# Patient Record
Sex: Female | Born: 2000 | Race: Black or African American | Hispanic: No | Marital: Single | Smoking: Never smoker
Health system: Southern US, Community
[De-identification: ages and names within clinical notes are randomized; demographics above are authoritative.]

## PROBLEM LIST (undated history)

## (undated) DIAGNOSIS — I10 Essential (primary) hypertension: Secondary | ICD-10-CM

## (undated) DIAGNOSIS — J45909 Unspecified asthma, uncomplicated: Secondary | ICD-10-CM

## (undated) DIAGNOSIS — L309 Dermatitis, unspecified: Secondary | ICD-10-CM

## (undated) DIAGNOSIS — M940 Chondrocostal junction syndrome [Tietze]: Secondary | ICD-10-CM

## (undated) HISTORY — DX: Essential (primary) hypertension: I10

---

## 2019-11-20 ENCOUNTER — Ambulatory Visit: Payer: Self-pay

## 2019-11-22 ENCOUNTER — Ambulatory Visit: Payer: Self-pay | Attending: Family

## 2019-11-22 DIAGNOSIS — Z23 Encounter for immunization: Secondary | ICD-10-CM

## 2019-11-22 NOTE — Progress Notes (Signed)
   Covid-19 Vaccination Clinic  Name:  Ellen Mann    MRN: [id number] DOB: March 09, 2001  11/22/2019  [name] was observed post Covid-19 immunization for 30 minutes based on pre-vaccination screening without incident. She was provided with Vaccine Information Sheet and instruction to access the V-Safe system.   [name] was instructed to call 911 with any severe reactions post vaccine: Marland Kitchen Difficulty breathing  . Swelling of face and throat  . A fast heartbeat  . A bad rash all over body  . Dizziness and weakness   Immunizations Administered    Name Date Dose VIS Date Route   JANSSEN COVID-19 VACCINE 11/22/2019 10:04 AM 0.5 mL 10/18/2019 Intramuscular   Manufacturer: [employer]   Lot: [id number]   NDC: 70786-754-49

## 2020-06-25 ENCOUNTER — Ambulatory Visit: Payer: 59 | Attending: Family

## 2020-06-25 DIAGNOSIS — Z23 Encounter for immunization: Secondary | ICD-10-CM

## 2020-07-01 ENCOUNTER — Ambulatory Visit (HOSPITAL_COMMUNITY)
Admission: EM | Admit: 2020-07-01 | Discharge: 2020-07-01 | Disposition: A | Discharge status: Home / Self Care | Payer: 59

## 2020-07-01 ENCOUNTER — Other Ambulatory Visit: Payer: Self-pay

## 2020-07-18 ENCOUNTER — Ambulatory Visit (HOSPITAL_COMMUNITY)
Admission: EM | Admit: 2020-07-18 | Discharge: 2020-07-18 | Disposition: A | Discharge status: Home / Self Care | Payer: 59

## 2020-07-18 ENCOUNTER — Ambulatory Visit (INDEPENDENT_AMBULATORY_CARE_PROVIDER_SITE_OTHER): Payer: 59

## 2020-07-18 ENCOUNTER — Other Ambulatory Visit: Payer: Self-pay

## 2020-07-18 ENCOUNTER — Encounter (HOSPITAL_COMMUNITY): Payer: Self-pay | Admitting: *Deleted

## 2020-07-18 DIAGNOSIS — M7989 Other specified soft tissue disorders: Secondary | ICD-10-CM

## 2020-07-18 DIAGNOSIS — S90122A Contusion of left lesser toe(s) without damage to nail, initial encounter: Secondary | ICD-10-CM

## 2020-07-18 DIAGNOSIS — M79672 Pain in left foot: Secondary | ICD-10-CM

## 2020-07-18 HISTORY — DX: Chondrocostal junction syndrome (tietze): M94.0

## 2020-07-18 HISTORY — DX: Unspecified asthma, uncomplicated: J45.909

## 2020-07-18 HISTORY — DX: Dermatitis, unspecified: L30.9

## 2020-07-18 MED ORDER — IBUPROFEN 800 MG PO TABS: 800.0000 mg | ORAL_TABLET | Freq: Three times a day (TID) | ORAL | 0 refills | Status: AC

## 2020-07-18 NOTE — ED Provider Notes (Signed)
[facility]    CSN: [id number] Arrival date & time: 07/18/20  1042      History   Chief Complaint Chief Complaint  Patient presents with   Foot Injury    HPI Ellen Mann is a 19 y.o. female history of asthma presenting today for evaluation of left foot injury.  Patient reports yesterday she was walking in hit her toe/foot on a door frame.  Since she has developed increased pain and swelling mainly to the fifth toe.  Denies pain in ankle or dorsum of foot.  Denies numbness or tingling.  HPI  Past Medical History:  Diagnosis Date   Asthma    Costochondritis    Eczema     There are no problems to display for this patient.   History reviewed. No pertinent surgical history.  OB History   No obstetric history on file.      Home Medications    Prior to Admission medications   Medication Sig Start Date End Date Taking? Authorizing Provider  albuterol (VENTOLIN HFA) 108 (90 Base) MCG/ACT inhaler Inhale into the lungs every 6 (six) hours as needed for wheezing or shortness of breath.   Yes [provider]  Dupilumab (DUPIXENT [place]) Inject into the skin.   Yes [provider]  Multiple Vitamin (MULTIVITAMIN PO) Take by mouth.   Yes [provider]  UNKNOWN TO PATIENT Oral contraceptives   Yes [provider]  ibuprofen (ADVIL) 800 MG tablet Take 1 tablet (800 mg total) by mouth 3 (three) times daily. 07/18/20   Kensie Susman, Junius Creamer, PA-C    Family History Family History  Problem Relation Age of Onset   Hypertension Mother    Asthma Father     Social History Social History   Tobacco Use   Smoking status: Never Smoker   Smokeless tobacco: Never Used  Building services engineer Use: Never used  Substance Use Topics   Alcohol use: Not Currently   Drug use: Never     Allergies   Patient has no known allergies.   Review of Systems Review of Systems  Constitutional: Negative for fatigue and fever.  HENT:  Negative for mouth sores.   Eyes: Negative for visual disturbance.  Respiratory: Negative for shortness of breath.   Cardiovascular: Negative for chest pain.  Gastrointestinal: Negative for abdominal pain, nausea and vomiting.  Genitourinary: Negative for genital sores.  Musculoskeletal: Positive for arthralgias and joint swelling.  Skin: Negative for color change, rash and wound.  Neurological: Negative for dizziness, weakness, light-headedness and headaches.     Physical Exam Triage Vital Signs ED Triage Vitals [07/18/20 1147]  Enc Vitals Group     BP 140/87     Pulse Rate 82     Resp 16     Temp 97.8 F (36.6 C)     Temp Source Oral     SpO2 100 %     Weight      Height      Head Circumference      Peak Flow      Pain Score 6     Pain Loc      Pain Edu?      Excl. in GC?    No data found.  Updated Vital Signs BP 140/87    Pulse 82    Temp 97.8 F (36.6 C) (Oral)    Resp 16    LMP 06/30/2020 (Exact Date)    SpO2 100%   Visual Acuity Right  Eye Distance:   Left Eye Distance:   Bilateral Distance:    Right Eye Near:   Left Eye Near:    Bilateral Near:     Physical Exam Vitals and nursing note reviewed.  Constitutional:      Appearance: She is well-developed.     Comments: No acute distress  HENT:     Head: Normocephalic and atraumatic.     Nose: Nose normal.  Eyes:     Conjunctiva/sclera: Conjunctivae normal.  Cardiovascular:     Rate and Rhythm: Normal rate.  Pulmonary:     Effort: Pulmonary effort is normal. No respiratory distress.  Abdominal:     General: There is no distension.  Musculoskeletal:        General: Normal range of motion.     Cervical back: Neck supple.     Comments: Left foot: Swelling and mild erythema noted to fifth toe, nontender throughout dorsum of foot along first through fifth metatarsals, dorsalis pedis 2+, sensation intact distally  Skin:    General: Skin is warm and dry.  Neurological:     Mental Status: She is alert  and oriented to person, place, and time.      UC Treatments / Results  Labs (all labs ordered are listed, but only abnormal results are displayed) Labs Reviewed - No data to display  EKG   Radiology DG Foot Complete Left  Result Date: 07/18/2020 CLINICAL DATA:  Pain and swelling following trauma EXAM: LEFT FOOT - COMPLETE 3+ VIEW COMPARISON:  None. FINDINGS: Frontal, oblique, and lateral views were obtained. There is no fracture or dislocation. Joint spaces appear normal. No erosive change. IMPRESSION: No fracture or dislocation.  No evident arthropathy. Electronically Signed   By: Bretta Bang III M.D.   On: 07/18/2020 12:23    Procedures Procedures (including critical care time)  Medications Ordered in UC Medications - No data to display  Initial Impression / Assessment and Plan / UC Course  I have reviewed the triage vital signs and the nursing notes.  Pertinent labs & imaging results that were available during my care of the patient were reviewed by me and considered in my medical decision making (see chart for details).     X-ray negative for acute fracture.  Treating as toe contusion.  Anti-inflammatories ice and elevation.  Will expect gradual resolution.  Discussed strict return precautions. Patient verbalized understanding and is agreeable with plan.  Final Clinical Impressions(s) / UC Diagnoses   Final diagnoses:  Contusion of lesser toe of left foot without damage to nail, initial encounter     Discharge Instructions     No fracture Use anti-inflammatories for pain/swelling. You may take up to 800 mg Ibuprofen every 8 hours with food. You may supplement Ibuprofen with Tylenol [phone] mg every 8 hours.  Ice and elevate    ED Prescriptions    Medication Sig Dispense Auth. Provider   ibuprofen (ADVIL) 800 MG tablet Take 1 tablet (800 mg total) by mouth 3 (three) times daily. 21 tablet Ernesha Ramone, Leland C, PA-C     PDMP not reviewed this  encounter.   Lew Dawes, PA-C 07/18/20 1234

## 2020-07-18 NOTE — Discharge Instructions (Signed)
No fracture °Use anti-inflammatories for pain/swelling. You may take up to 800 mg Ibuprofen every 8 hours with food. You may supplement Ibuprofen with Tylenol 500-1000 mg every 8 hours.  °Ice and elevate °

## 2020-07-18 NOTE — ED Triage Notes (Signed)
Reports hitting left foot and 5th toe on door frame yesterday.  C/O swelling and continued pain.  LLE CMS intact.

## 2020-09-13 NOTE — Progress Notes (Signed)
   Covid-19 Vaccination Clinic  Name:  Alajiah Dutkiewicz    MRN: [id number] DOB: Mar 18, 2001  09/13/2020  Ms. Shepperson was observed post Covid-19 immunization for 15 minutes without incident. She was provided with Vaccine Information Sheet and instruction to access the V-Safe system.   Ms. Hulgan was instructed to call 911 with any severe reactions post vaccine: Marland Kitchen Difficulty breathing  . Swelling of face and throat  . A fast heartbeat  . A bad rash all over body  . Dizziness and weakness   Immunizations Administered    Name Date Dose VIS Date Route   PFIZER Comrnaty(Gray TOP) Covid-19 Vaccine 06/25/2020  4:40 PM 0.3 mL 07/29/2020 Intramuscular   Manufacturer: [employer]   Lot: [id number]   NDC: [phone]

## 2021-05-11 ENCOUNTER — Emergency Department (HOSPITAL_COMMUNITY)
Admission: EM | Admit: 2021-05-11 | Discharge: 2021-05-12 | Disposition: A | Discharge status: Home / Self Care | Payer: BC Managed Care – PPO | Attending: Emergency Medicine | Admitting: Emergency Medicine

## 2021-05-11 ENCOUNTER — Encounter (HOSPITAL_COMMUNITY): Payer: Self-pay | Admitting: Emergency Medicine

## 2021-05-11 DIAGNOSIS — R197 Diarrhea, unspecified: Secondary | ICD-10-CM | POA: Insufficient documentation

## 2021-05-11 DIAGNOSIS — Z5321 Procedure and treatment not carried out due to patient leaving prior to being seen by health care provider: Secondary | ICD-10-CM | POA: Insufficient documentation

## 2021-05-11 DIAGNOSIS — R112 Nausea with vomiting, unspecified: Secondary | ICD-10-CM | POA: Insufficient documentation

## 2021-05-11 LAB — CBC WITH DIFFERENTIAL/PLATELET
Abs Immature Granulocytes: 0.02 10*3/uL (ref 0.00–0.07)
Basophils Absolute: 0 10*3/uL (ref 0.0–0.1)
Basophils Relative: 0 %
Eosinophils Absolute: 0 10*3/uL (ref 0.0–0.5)
Eosinophils Relative: 0 %
HCT: 44.4 % (ref 36.0–46.0)
Hemoglobin: 13.8 g/dL (ref 12.0–15.0)
Immature Granulocytes: 0 %
Lymphocytes Relative: 16 %
Lymphs Abs: 1.3 10*3/uL (ref 0.7–4.0)
MCH: 25.3 pg — ABNORMAL LOW (ref 26.0–34.0)
MCHC: 31.1 g/dL (ref 30.0–36.0)
MCV: 81.5 fL (ref 80.0–100.0)
Monocytes Absolute: 0.4 10*3/uL (ref 0.1–1.0)
Monocytes Relative: 5 %
Neutro Abs: 6.3 10*3/uL (ref 1.7–7.7)
Neutrophils Relative %: 79 %
Platelets: 263 10*3/uL (ref 150–400)
RBC: 5.45 MIL/uL — ABNORMAL HIGH (ref 3.87–5.11)
RDW: 14.6 % (ref 11.5–15.5)
WBC: 8 10*3/uL (ref 4.0–10.5)
nRBC: 0 % (ref 0.0–0.2)

## 2021-05-11 LAB — COMPREHENSIVE METABOLIC PANEL
ALT: 28 U/L (ref 0–44)
AST: 29 U/L (ref 15–41)
Albumin: 3.8 g/dL (ref 3.5–5.0)
Alkaline Phosphatase: 38 U/L (ref 38–126)
Anion gap: 9 (ref 5–15)
BUN: 14 mg/dL (ref 6–20)
CO2: 25 mmol/L (ref 22–32)
Calcium: 9.5 mg/dL (ref 8.9–10.3)
Chloride: 104 mmol/L (ref 98–111)
Creatinine, Ser: 0.9 mg/dL (ref 0.44–1.00)
GFR, Estimated: >60 mL/min (ref >60)
Glucose, Bld: 84 mg/dL (ref 70–99)
Potassium: 4 mmol/L (ref 3.5–5.1)
Sodium: 138 mmol/L (ref 135–145)
Total Bilirubin: 0.7 mg/dL (ref 0.3–1.2)
Total Protein: 7.4 g/dL (ref 6.5–8.1)

## 2021-05-11 LAB — I-STAT BETA HCG BLOOD, ED (MC, WL, AP ONLY): I-stat hCG, quantitative: <5 m[IU]/mL (ref <5)

## 2021-05-11 LAB — URINALYSIS, ROUTINE W REFLEX MICROSCOPIC
Bilirubin Urine: NEGATIVE
Glucose, UA: NEGATIVE mg/dL
Hgb urine dipstick: NEGATIVE
Ketones, ur: >80 mg/dL — AB
Leukocytes,Ua: NEGATIVE
Nitrite: NEGATIVE
Protein, ur: NEGATIVE mg/dL
Specific Gravity, Urine: >1.03 — ABNORMAL HIGH (ref 1.005–1.030)
pH: 6 (ref 5.0–8.0)

## 2021-05-11 LAB — LIPASE, BLOOD: Lipase: 31 U/L (ref 11–51)

## 2021-05-11 MED ORDER — ONDANSETRON 4 MG PO TBDP
4.0000 mg | ORAL_TABLET | Freq: Once | ORAL | Status: AC
  Administered 2021-05-11: 4 mg via ORAL
  Filled 2021-05-11: qty 1

## 2021-05-11 NOTE — ED Provider Notes (Signed)
Emergency Medicine Provider Triage Evaluation Note  Ellen Mann , a 20 y.o. female  was evaluated in triage.  Pt complains of nausea, vomiting, and diarrhea.  Patient symptoms started this morning.  No abdominal pain, hematemesis, coffee-ground emesis, melena or blood in stool.  No recent international travel, antibiotic use, or camping.  LMP 1 week prior    Review of Systems  Positive: Nausea, vomiting, diarrhea Negative: abdominal pain, hematemesis, coffee-ground emesis, melena,  blood in stool, dysuria, hematuria, urinary urgency, vaginal pain, vaginal bleeding, vaginal discharge.  Physical Exam  BP 137/88 (BP Location: Right Arm)   Pulse 85   Temp 98 F (36.7 C) (Oral)   Resp 18   Ht 5\' 5"  (1.651 m)   Wt 88.5 kg   LMP 05/04/2021   SpO2 100%   BMI 32.45 kg/m  Gen:   Awake, no distress   Resp:  Normal effort  MSK:   Moves extremities without difficulty  Other:  Abdomen soft, nondistended, nontender.  No peritoneal signs.  Medical Decision Making  Medically screening exam initiated at 1:46 PM.  Appropriate orders placed.  Ellen Mann was informed that the remainder of the evaluation will be completed by another provider, this initial triage assessment does not replace that evaluation, and the importance of remaining in the ED until their evaluation is complete.  Patient given ODT Zofran.  The patient appears stable so that the remainder of the work up may be completed by another provider.      Derenda Mis 05/11/21 1350    05/13/21, MD 05/13/21 [phone]

## 2021-05-11 NOTE — ED Triage Notes (Signed)
PT reports nausea, vomting, diarrhea this morning. Denies black tarry stools. VSS. NAD at present.

## 2021-05-11 NOTE — ED Notes (Addendum)
Pt walked out and stated she didn't want to wait any longer

## 2021-10-31 NOTE — Progress Notes (Signed)
11/02/2021 Lettye Bohls [id number] 2001-02-16   ASSESSMENT AND PLAN:   Diarrhea, unspecified type -     C-reactive protein; Future -     Sedimentation rate; Future -     Thyroid Cascade Profile; Future -     Calprotectin, Fecal; Future -     DG Abd 1 View; Future - pending labs from PCP for Cdiff, GI pathogen - will add on fecal calprotectin and get inflammatory labs to evaluate further for IBD. 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 pylori antigen, stool; Future- will do in 2 more weeks -     DG Abd 1 View; Future - negative celiac with PCP - will get KUB, labs, FODMAP discussed.  SVT (supraventricular tachycardia) (HCC) -     Thyroid Cascade Profile; Future - had hyperthyroidism last time, will recheck.  - taught vagal maneuver in office.    Patient Care Team: Patient, No Pcp Per (Inactive) as PCP - General (General Practice)  HISTORY OF PRESENT ILLNESS: 21 y.o. female referred by Emi Belfast, FNP, with a past medical history of asthma and eczema and others listed below presents for evaluation of abdominal pain, diarrhea.  Labs reviewed from 10/18/2021 showed normal kidney and liver, no anemia, no leukocytosis, hyperthyroidism 0.427, normal lipase negative celiac with normal TTG G IgA, very weakly elevated TTG IgG 6.  Had diarrhea and vomiting 05/2021 went to ER but not seen due to wait time.  Has tried prilosec and gas.  Patient;s mom is on the phone and provides some history  Had COVID August last year, has been having tachycardia and GI issues since that time, worse since Dec.  Was having tachycardia in the room 120-130's, some mild dizziness and anxiety,  used vagal maneuver to help bring back to 80s and patient felt better.   Patient states Oct 2022 had diarrhea, vomiting, nausea with bloating. Thought stomach virus, since that time she has had symptoms.  She has had intermittent diarrhea, worse with stress, AB bloating, decreased appetite.  States diarrhea has been 3-5 a day, worse after food,  last week has improved to once a day and then today was 3 time this AM.. Denies blood in stool, dark black stool.  Can have harder stools or loose stool.  Has not has nausea since stopping the MVIT.  She continues to have AB bloating, can have sharp AB discomfort right after eating, can have AB bloating that wakes up with gas movements and beltching.  She has had chest pain and tachycardia.  Has had some weight loss since that time 10 lbs per patient.  Has been off prilosec to do H pylori, pending stools from PCP, has been off x 1 week.   Current Medications:   Current Outpatient Medications (Endocrine & Metabolic):    APRI 0.15-30 MG-MCG tablet, Take 1 tablet by mouth daily.   Current Outpatient Medications (Respiratory):    albuterol (VENTOLIN HFA) 108 (90 Base) MCG/ACT inhaler, Inhale into the lungs every 6 (six) hours as needed for wheezing or shortness of breath.   azelastine (ASTELIN) 0.1 % nasal spray, SMARTSIG:2 Spray(s) Both Nares Twice Daily PRN   cetirizine (ZYRTEC) 10  MG tablet, Take by mouth.  Current Outpatient Medications (Analgesics):    ibuprofen (ADVIL) 800 MG tablet, Take 1 tablet (800 mg total) by mouth 3 (three) times daily.   Current Outpatient Medications (Other):    Dupilumab (DUPIXENT [place]), Inject into the skin.   Multiple Vitamin (MULTIVITAMIN PO), Take by mouth.   UNKNOWN TO PATIENT, Oral contraceptives  Medical History:  Past Medical History:  Diagnosis Date   Asthma    Costochondritis    Eczema    Allergies: No Known Allergies   Surgical History:  She  has no past surgical history on file. Family  History:  Her family history includes Asthma in her father; Hypertension in her mother. Social History:   reports that she has never smoked. She has never used smokeless tobacco. She reports that she does not currently use alcohol. She reports that she does not use drugs.  REVIEW OF SYSTEMS  : All other systems reviewed and negative except where noted in the History of Present Illness.   PHYSICAL EXAM: BP (!) 140/92   Pulse 92   Ht 5\' 5"  (1.651 m)   Wt 182 lb 3.2 oz (82.6 kg)   SpO2 99%   BMI 30.32 kg/m  General:   Pleasant, well developed female in no acute distress Head:  Normocephalic and atraumatic. Eyes: sclerae anicteric,conjunctive pink  Heart:  regular rate and rhythm Pulm: Clear anteriorly; no wheezing Abdomen:  Soft, Flat AB, skin exam normal, Normal bowel sounds.  no  tenderness, patient did have excessive belching with palpation. Without guarding and Without rebound, without hepatomegaly. Extremities:  Without edema. Msk:  Symmetrical without gross deformities. Peripheral pulses intact.  Neurologic:  Alert and  oriented x4;  grossly normal neurologically. Skin:   Dry and intact without significant lesions or rashes. Psychiatric: Demonstrates good judgement and reason without abnormal affect or behaviors.   Doree Albee, PA-C 10:10 AM

## 2021-11-02 ENCOUNTER — Encounter: Payer: Self-pay | Admitting: Physician Assistant

## 2021-11-02 ENCOUNTER — Ambulatory Visit (INDEPENDENT_AMBULATORY_CARE_PROVIDER_SITE_OTHER): Payer: BC Managed Care – PPO | Admitting: Physician Assistant

## 2021-11-02 ENCOUNTER — Other Ambulatory Visit (INDEPENDENT_AMBULATORY_CARE_PROVIDER_SITE_OTHER): Payer: BC Managed Care – PPO

## 2021-11-02 VITALS — BP 140/92 | HR 92 | Ht 65.0 in | Wt 182.2 lb

## 2021-11-02 DIAGNOSIS — R197 Diarrhea, unspecified: Secondary | ICD-10-CM | POA: Diagnosis not present

## 2021-11-02 DIAGNOSIS — I471 Supraventricular tachycardia: Secondary | ICD-10-CM

## 2021-11-02 DIAGNOSIS — R140 Abdominal distension (gaseous): Secondary | ICD-10-CM | POA: Diagnosis not present

## 2021-11-02 LAB — C-REACTIVE PROTEIN: CRP: 1.2 mg/dL (ref 0.5–20.0)

## 2021-11-02 LAB — SEDIMENTATION RATE: Sed Rate: 26 mm/hr — ABNORMAL HIGH (ref 0–20)

## 2021-11-02 NOTE — Patient Instructions (Addendum)
FDGARD over the counter ?Over the counter digestive enzymes ? ?Follow up cardiology ?Can do vagal maneuvers ? ?DO STOOL SAMPLES IN 2 MORE WEEKS ? ?Please go to the ER if you have any severe AB pain, unable to hold down food/water, blood in stool or vomit, chest pain, shortness of breath, or any worsening symptoms.  ?  ?Consider keeping a food diary- common causes of diarrhea are dairy, certain carbs... ?  ?FODMAP stands for fermentable oligo-, di-, mono-saccharides and polyols (1). ?These are the scientific terms used to classify groups of carbs that are notorious for triggering digestive symptoms like bloating, gas and stomach pain.  ? ?FODMAPs are found in a wide range of foods in varying amounts. Some foods contain just one type, while others contain several. ? ?The main dietary sources of the four groups of FODMAPs include:  ?Oligosaccharides: Wheat, rye, legumes and various fruits and vegetables, such as garlic and onions. ? ?Disaccharides: Milk, yogurt and soft cheese. Lactose is the main carb. ? ?Monosaccharides: Various fruit including figs and mangoes, and sweeteners such as honey and agave nectar. Fructose is the main carb. ? ?Polyols: Certain fruits and vegetables including blackberries and lychee, as well as some low-calorie sweeteners like those in sugar-free gum. ?  ?Keep a food diary. This will help you identify foods that cause symptoms. Write down: ?What you eat and when. ?What symptoms you have. ?When symptoms occur in relation to your meals. ?Avoid foods that cause symptoms. Talk with your dietitian about other ways to get the same nutrients that are in these foods. ?Eat your meals slowly, in a relaxed setting. ?Aim to eat 5-6 small meals per day. Do not skip meals. ?Drink enough fluids to keep your urine clear or pale yellow. ?If dairy products cause your symptoms to flare up, try eating less of them. You might be able to handle yogurt better than other dairy products because it contains bacteria  that help with digestion. ?  ? ? ? ? ?Your provider has requested that you go to the basement level for lab work before leaving today. Press "B" on the elevator. The lab is located at the first door on the left as you exit the elevator.  ? ?Due to recent changes in healthcare laws, you may see the results of your imaging and laboratory studies on MyChart before your provider has had a chance to review them.  We understand that in some cases there may be results that are confusing or concerning to you. Not all laboratory results come back in the same time frame and the provider may be waiting for multiple results in order to interpret others.  Please give [place] 48 hours in order for your provider to thoroughly review all the results before contacting the office for clarification of your results.   ? ?I appreciate the  opportunity to care for you ? ?Thank You  ? ?Amanda Collier,PA-C ?

## 2021-11-02 NOTE — Progress Notes (Signed)
Attending Physician's Attestation  ? ?I have reviewed the chart.  ? ?I agree with the Advanced Practitioner's note, impression, and recommendations with any updates as below. ?If work-up above is unremarkable then proceed with an abdominal ultrasound and if continued issues then consideration of EGD/colonoscopy thereafter. ? ? ?Corliss Parish, MD ?[facility] ?Advanced Endoscopy ?Office # [phone] ? ?

## 2021-11-03 LAB — THYROID CASCADE PROFILE: TSH: 0.598 u[IU]/mL (ref 0.450–4.500)

## 2021-11-09 ENCOUNTER — Other Ambulatory Visit: Payer: Self-pay

## 2021-11-09 ENCOUNTER — Ambulatory Visit (INDEPENDENT_AMBULATORY_CARE_PROVIDER_SITE_OTHER)
Admission: RE | Admit: 2021-11-09 | Discharge: 2021-11-09 | Disposition: A | Discharge status: Home / Self Care | Payer: BC Managed Care – PPO | Source: Ambulatory Visit | Attending: Physician Assistant | Admitting: Physician Assistant

## 2021-11-09 ENCOUNTER — Other Ambulatory Visit: Payer: BC Managed Care – PPO

## 2021-11-09 DIAGNOSIS — R197 Diarrhea, unspecified: Secondary | ICD-10-CM

## 2021-11-09 DIAGNOSIS — R140 Abdominal distension (gaseous): Secondary | ICD-10-CM

## 2021-11-11 LAB — H. PYLORI ANTIGEN, STOOL: H pylori Ag, Stl: NEGATIVE

## 2021-11-14 ENCOUNTER — Telehealth: Payer: Self-pay | Admitting: Physician Assistant

## 2021-11-14 LAB — CALPROTECTIN, FECAL: Calprotectin, Fecal: 31 ug/g (ref 0–120)

## 2021-11-14 LAB — SPECIMEN STATUS REPORT

## 2021-11-14 NOTE — Telephone Encounter (Signed)
Spoke with patient regarding results & recommendations to incorporate benefiber. Patient verbalized understanding. ?

## 2021-11-14 NOTE — Telephone Encounter (Signed)
Patient returned your phone call.  Please call back.  Thank you. 

## 2021-11-14 NOTE — Telephone Encounter (Signed)
Left message for patient to call back  

## 2021-12-15 NOTE — Progress Notes (Signed)
? ? ?12/16/2021 ?Ellen Mann ?[id number] ?[birth year] ? ?Primary GI doctor: Dr. Meridee Score ? ?ASSESSMENT AND PLAN:  ? ?Diarrhea, unspecified type ?Abdominal bloating ?No AB pain, no hematochezia ?TSH 0.59  ?Negative fecal calprotectin, sed rate slightly elevated at 26, CRP negative at 1.2. ?negative H. pylori  ?Significant improvement with FODMAP diet, can refer to dietician if Ellen Mann wants.  ?Take fiber consistently ?Graduating soon and hopefully stress will decrease which worsens symptoms ?If symptoms worsen or return can get AB [place] and do trial of xifaxin.  ?Patient going to move to [facility], can follow up here if needed but may get GI doctor there.  ? ?History of Present Illness:  ?21 y.o. female  with a past medical history of asthma and eczema  and others listed below, returns to clinic today for evaluation of diarrhea with abdominal bloating. ? ?11/02/2021 patient was seen for diarrhea beginning since COVID infection August 2022. ?At that time patient having intermittent diarrhea worse with stress worse after food.   ?No blood in the stool or melena.  Abdominal bloating and discomfort after eating. ?Patient had repeat thyroid which was normal at 0.59 (cutoff 0.45) ?Negative fecal calprotectin, sed rate slightly elevated at 26, CRP negative at 1.2. ?negative H. pylori  ?11/09/2021 KUB showed moderate colonic stool burden predominantly right hemicolon and rectum, nonobstructive bowel gas pattern. ?Suggested adding on Benefiber, consider Xifaxan, consider abdominal ultrasound. ?If not improved can consider endoscopic evaluation ? ?Patient presents today for follow-up. ?Patient started FODMAP diet and states symptoms have improved, Ellen Mann states Ellen Mann has cut back on dairy especially cheese. Bloating has improved.  ?No AB pain.  ?Ellen Mann is on fiber intermittently,since Ellen Mann was hear Ellen Mann has had HTN/palpitations, added on norvasc and had heart monitor.  ?Stopped her BCP and switched due to elevated BP.  ?No nausea, vomiting, no AB  pain, no blood in the stool.  ?Ellen Mann is graduating and moving to [place], may get colon there.  ? ?Current Medications:  ? ?Current Outpatient Medications (Endocrine & Metabolic):  ?  APRI 0.15-30 MG-MCG tablet, Take 1 tablet by mouth daily. ? ?Current Outpatient Medications (Cardiovascular):  ?  amLODipine (NORVASC) 5 MG tablet, Take 5 mg by mouth daily. ? ?Current Outpatient Medications (Respiratory):  ?  albuterol (VENTOLIN HFA) 108 (90 Base) MCG/ACT inhaler, Inhale into the lungs every 6 (six) hours as needed for wheezing or shortness of breath. ?  azelastine (ASTELIN) 0.1 % nasal spray, SMARTSIG:2 Spray(s) Both Nares Twice Daily PRN ?  cetirizine (ZYRTEC) 10 MG tablet, Take by mouth. ? ?Current Outpatient Medications (Analgesics):  ?  ibuprofen (ADVIL) 800 MG tablet, Take 1 tablet (800 mg total) by mouth 3 (three) times daily. ? ? ?Current Outpatient Medications (Other):  ?  Dupilumab (DUPIXENT [employer]), Inject into the skin. ?  mirtazapine (REMERON) 7.5 MG tablet, Take 7.5 mg by mouth at bedtime. ?  Multiple Vitamin (MULTIVITAMIN PO), Take by mouth. ? ?Surgical History:  ?Ellen Mann  has no past surgical history on file. ?Family History:  ?Her family history includes Asthma in her father; Hypertension in her mother. ?Social History:  ? reports that Ellen Mann has never smoked. Ellen Mann has never used smokeless tobacco. Ellen Mann reports that Ellen Mann does not currently use alcohol. Ellen Mann reports that Ellen Mann does not use drugs. ? ?Current Medications, Allergies, Past Medical History, Past Surgical History, Family History and Social History were reviewed in [employer] record. ? ?Physical Exam: ?BP (!) 142/74   Pulse 86   Ht 5' 5.5" (1.664 m)  Wt 186 lb (84.4 kg)   BMI 30.48 kg/m?  ?General:   Pleasant, well developed female in no acute distress ?Heart : Regular rate and rhythm; no murmurs ?Pulm: Clear anteriorly; no wheezing ?Abdomen:  Soft, Obese AB, Active bowel sounds. No tenderness . , No organomegaly  appreciated. ?Extremities:  without  edema. ?Neurologic:  Alert and  oriented x4;  No focal deficits.  ?Psych:  Cooperative. Normal mood and affect. ? ? ?Doree Albee, PA-C ?12/16/21 ?

## 2021-12-16 ENCOUNTER — Encounter: Payer: Self-pay | Admitting: Physician Assistant

## 2021-12-16 ENCOUNTER — Ambulatory Visit (INDEPENDENT_AMBULATORY_CARE_PROVIDER_SITE_OTHER): Payer: BC Managed Care – PPO | Admitting: Physician Assistant

## 2021-12-16 VITALS — BP 142/74 | HR 86 | Ht 65.5 in | Wt 186.0 lb

## 2021-12-16 DIAGNOSIS — R140 Abdominal distension (gaseous): Secondary | ICD-10-CM

## 2021-12-16 DIAGNOSIS — R197 Diarrhea, unspecified: Secondary | ICD-10-CM | POA: Diagnosis not present

## 2021-12-16 NOTE — Patient Instructions (Addendum)
Can check out [employer] ?Look for dietician's in your area ?Let us know if you need a referral.  ? ?Recommend starting on a fiber supplement, can try metamucil first but if this causes gas/bloating switch to benefiber ?Take with fiber with with a full 8 oz glass of water once a day. This can take 1 month to start helping, so try for at least one month.  ?Recommend increasing water and activity.  ?Get a squatty potty to use at home or try a stool, goal is to get your knees above your hips during a bowel movement. ? ?- Drink at least 64-80 ounces of water/liquid per day. ?- Establish a time to try to move your bowels every day.  For many people, this is after a cup of coffee or after a meal such as breakfast. ?- Sit all of the way back on the toilet keeping your back fairly straight and while sitting up, try to rest the tops of your forearms on your upper thighs.   ?- Raising your feet with a step stool/squatty potty can be helpful to improve the angle that allows your stool to pass through the rectum. ?- Relax the rectum feeling it bulge toward the toilet water.  If you feel your rectum raising toward your body, you are contracting rather than relaxing. ?- Breathe in and slowly exhale. "Belly breath" by expanding your belly towards your belly button. Keep belly expanded as you gently direct pressure down and back to the anus.  A low pitched GRRR sound can assist with increasing intra-abdominal pressure.  ?- Repeat 3-4 times. If unsuccessful, contract the pelvic floor to restore normal tone and get off the toilet.  Avoid excessive straining. ?- To reduce excessive wiping by teaching your anus to normally contract, place hands on outer aspect of knees and resist knee movement outward.  Hold 5-10 second then place hands just inside of knees and resist inward movement of knees.  Hold 5 seconds.  Repeat a few times each way. ? ?Go to the ER if unable to pass gas, severe AB pain, unable to hold down food, any shortness  of breath of chest pain.  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  ?If you have blood in your stool please call us.  ? ? ?

## 2021-12-17 NOTE — Progress Notes (Signed)
Attending Physician's Attestation   I have reviewed the chart.   I agree with the Advanced Practitioner's note, impression, and recommendations with any updates as below.    Eliyahu Bille Mansouraty, MD [facility] Office # [phone]  

## 2023-09-02 IMAGING — DX DG ABDOMEN 1V
2 series · 2 of 2 positions shown · non-contrast
Comparison: None.

CLINICAL DATA: rule out obstruction, evaluate stool burden

EXAM:
ABDOMEN - 1 VIEW

[abdomen kub (1 of 2)]
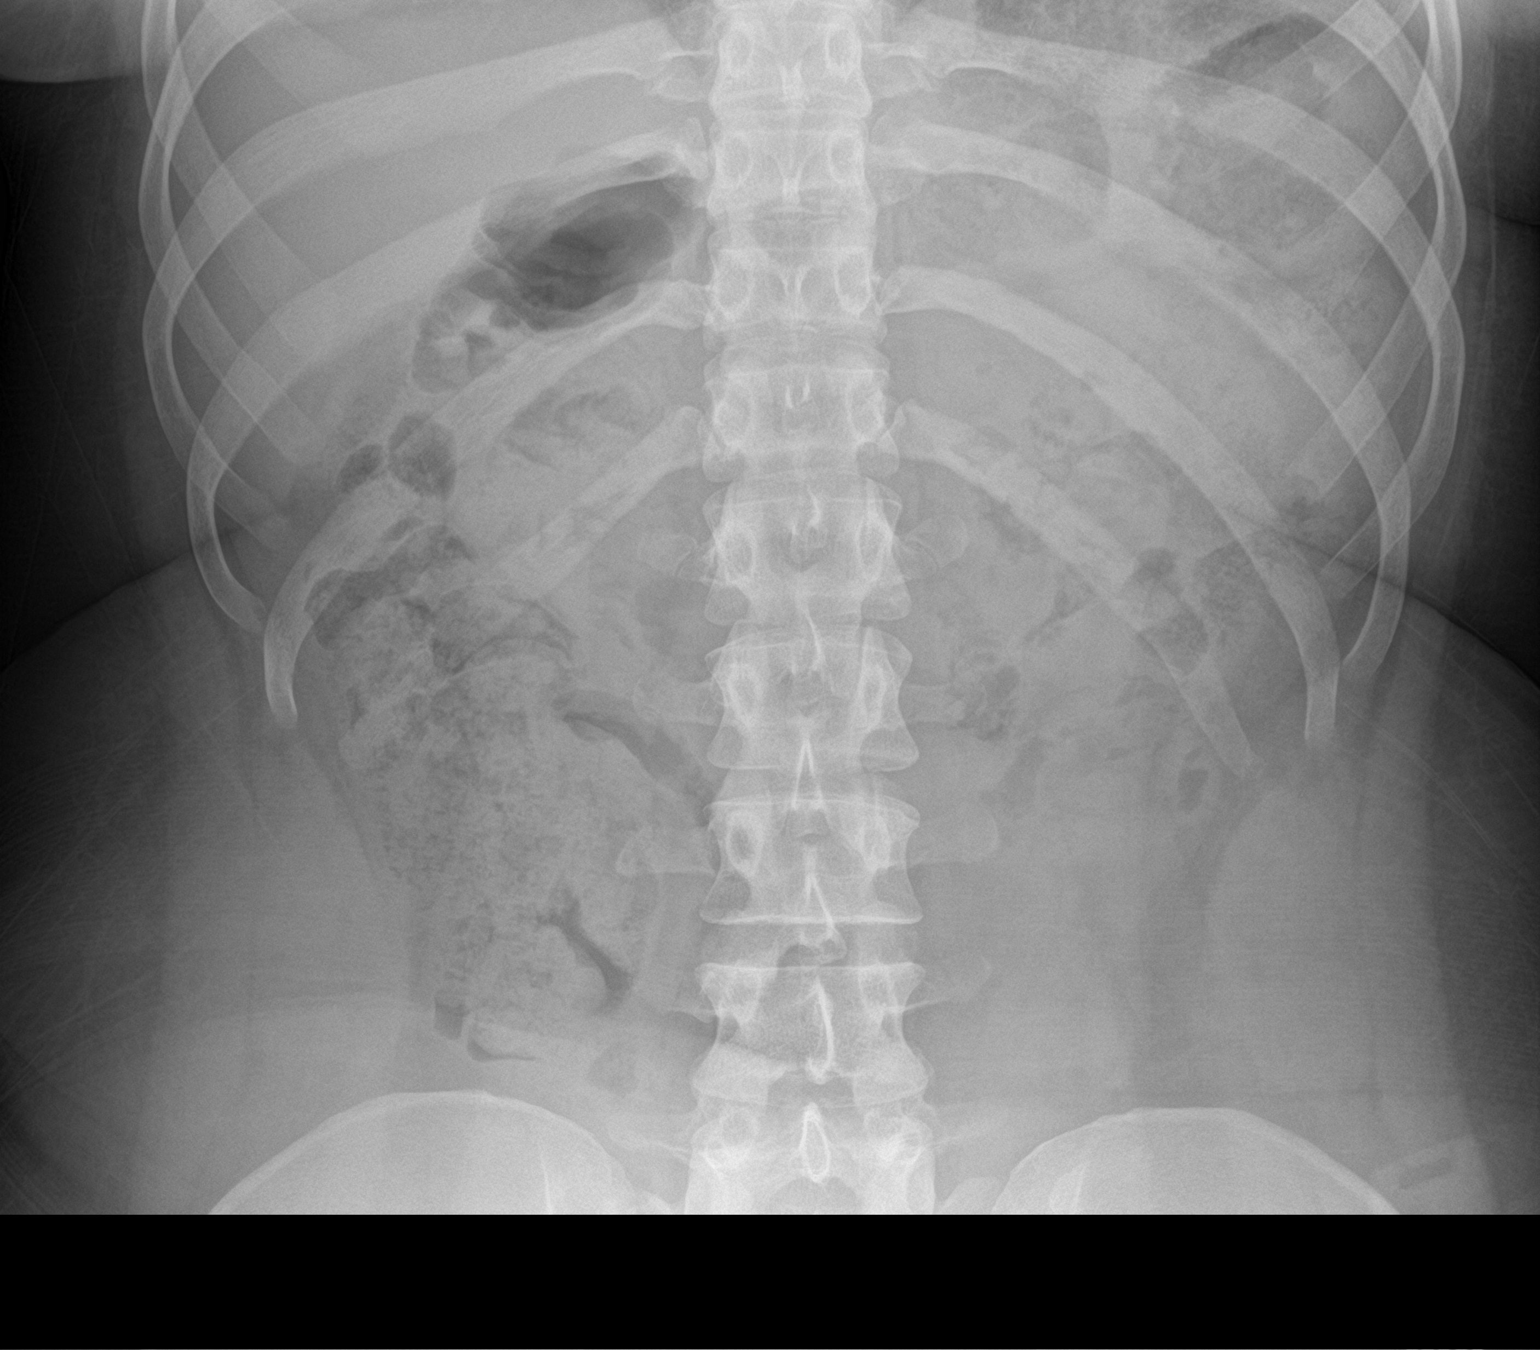

[abdomen kub (2 of 2)]
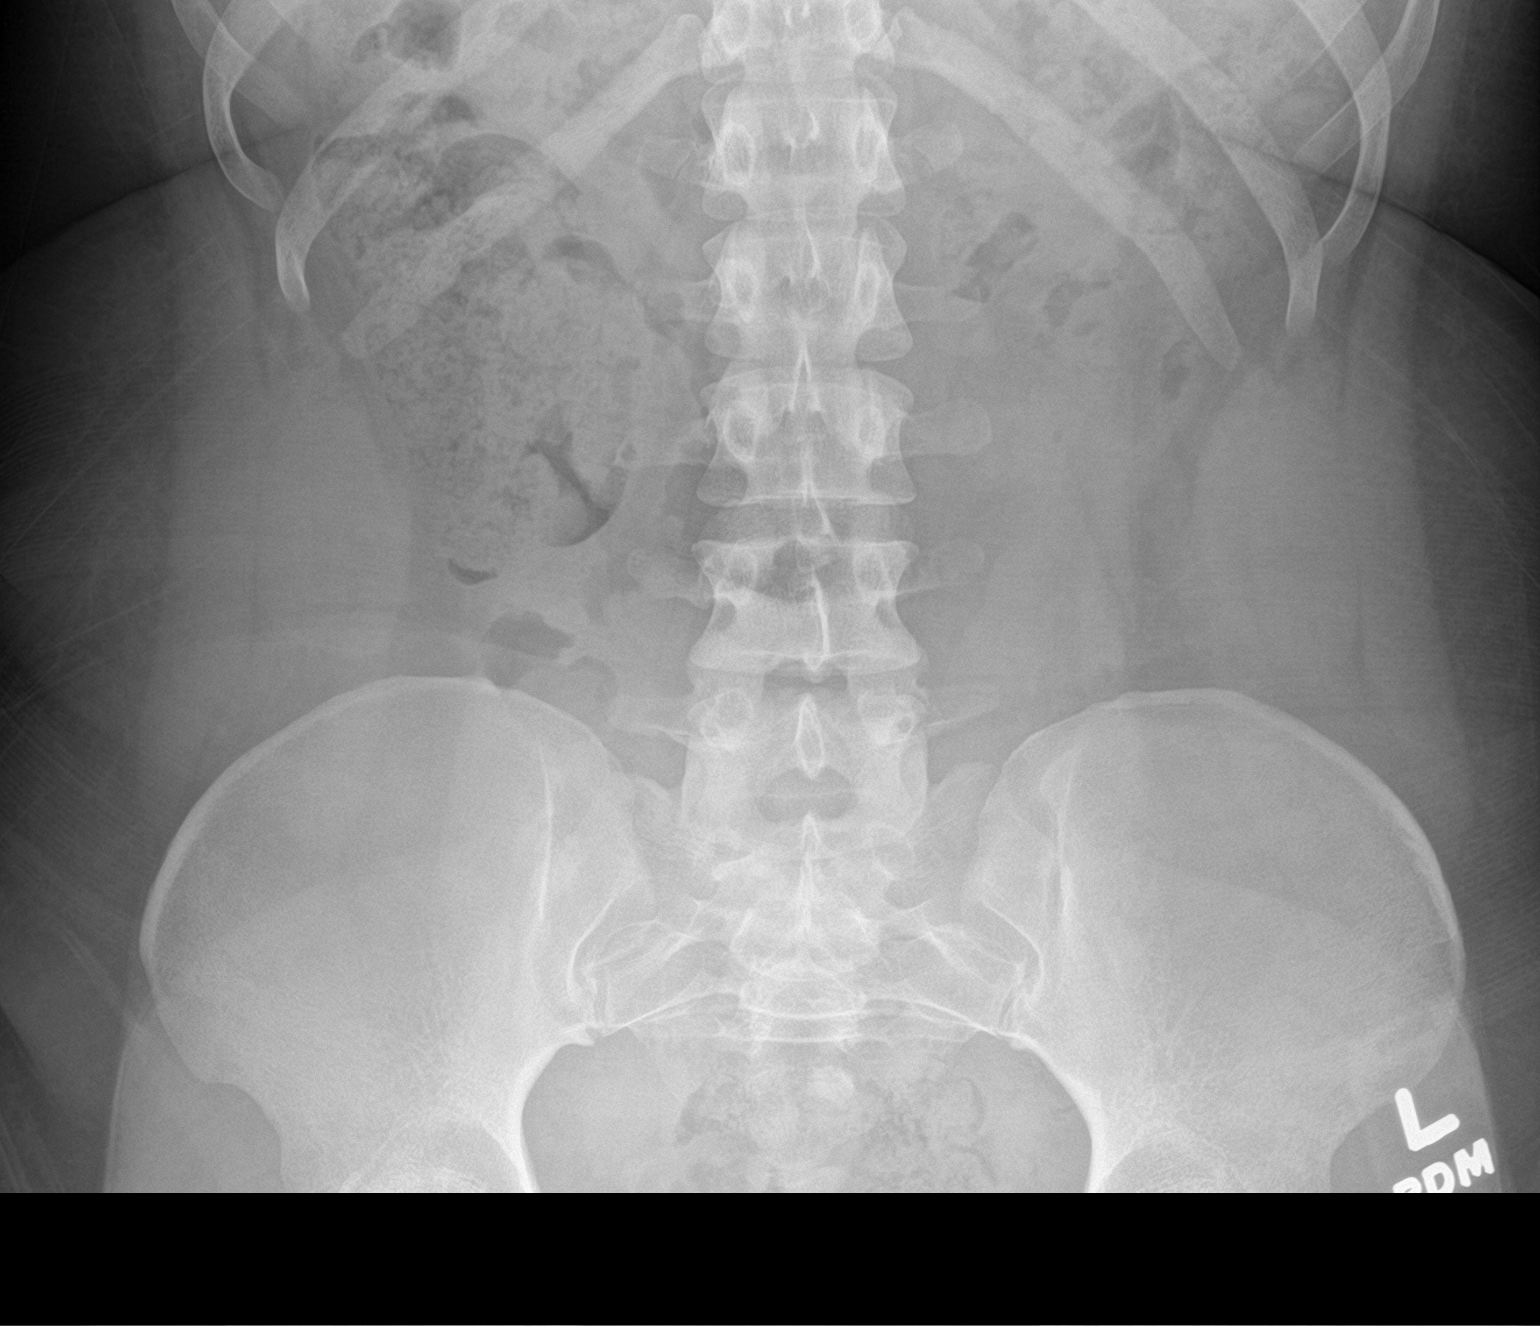

[2 of 2 positions shown; findings below may reference images not displayed]

FINDINGS: Air and stool-filled nondilated loops of bowel. Moderate colonic
stool burden predominately in the RIGHT hemicolon and rectum.
Incomplete visualization of the pelvis. No acute osseous abnormality
visualized.
IMPRESSION: Nonobstructive bowel gas pattern.
